# Patient Record
Sex: Female | Born: 1995 | Race: Black or African American | Hispanic: No | Marital: Single | Smoking: Never smoker
Health system: Southern US, Community
[De-identification: ages and names within clinical notes are randomized; demographics above are authoritative.]

---
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
## 2023-06-28 ENCOUNTER — Ambulatory Visit: Payer: Self-pay | Admitting: Dietician

## 2023-09-17 ENCOUNTER — Inpatient Hospital Stay (HOSPITAL_COMMUNITY): Admit: 2023-09-17 | Admitting: Obstetrics and Gynecology
